# Patient Record
Sex: Female | Born: 2000 | Race: Black or African American | Hispanic: No | Marital: Single | Smoking: Never smoker
Health system: Southern US, Community
[De-identification: ages and names within clinical notes are randomized; demographics above are authoritative.]

---

## 2021-05-24 ENCOUNTER — Emergency Department (HOSPITAL_BASED_OUTPATIENT_CLINIC_OR_DEPARTMENT_OTHER): Payer: BC Managed Care – PPO

## 2021-05-24 ENCOUNTER — Emergency Department (HOSPITAL_BASED_OUTPATIENT_CLINIC_OR_DEPARTMENT_OTHER)
Admission: EM | Admit: 2021-05-24 | Discharge: 2021-05-24 | Disposition: A | Discharge status: Home / Self Care | Payer: BC Managed Care – PPO | Attending: Emergency Medicine | Admitting: Emergency Medicine

## 2021-05-24 ENCOUNTER — Other Ambulatory Visit: Payer: Self-pay

## 2021-05-24 ENCOUNTER — Encounter (HOSPITAL_BASED_OUTPATIENT_CLINIC_OR_DEPARTMENT_OTHER): Payer: Self-pay | Admitting: *Deleted

## 2021-05-24 DIAGNOSIS — M25562 Pain in left knee: Secondary | ICD-10-CM | POA: Diagnosis not present

## 2021-05-24 DIAGNOSIS — R079 Chest pain, unspecified: Secondary | ICD-10-CM | POA: Diagnosis not present

## 2021-05-24 DIAGNOSIS — M542 Cervicalgia: Secondary | ICD-10-CM | POA: Diagnosis not present

## 2021-05-24 DIAGNOSIS — M62830 Muscle spasm of back: Secondary | ICD-10-CM | POA: Insufficient documentation

## 2021-05-24 DIAGNOSIS — M62838 Other muscle spasm: Secondary | ICD-10-CM

## 2021-05-24 DIAGNOSIS — Y92410 Unspecified street and highway as the place of occurrence of the external cause: Secondary | ICD-10-CM | POA: Diagnosis not present

## 2021-05-24 MED ORDER — CYCLOBENZAPRINE HCL 10 MG PO TABS: 10.0000 mg | ORAL_TABLET | Freq: Two times a day (BID) | ORAL | 0 refills | Status: AC | PRN

## 2021-05-24 MED ORDER — LIDOCAINE 5 % EX PTCH: 1.0000 | MEDICATED_PATCH | CUTANEOUS | 0 refills | Status: AC
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
## 2021-11-11 ENCOUNTER — Emergency Department (HOSPITAL_BASED_OUTPATIENT_CLINIC_OR_DEPARTMENT_OTHER)
Admission: EM | Admit: 2021-11-11 | Discharge: 2021-11-11 | Disposition: A | Discharge status: Home / Self Care | Payer: BC Managed Care – PPO | Attending: Emergency Medicine | Admitting: Emergency Medicine

## 2021-11-11 ENCOUNTER — Emergency Department (HOSPITAL_BASED_OUTPATIENT_CLINIC_OR_DEPARTMENT_OTHER): Payer: BC Managed Care – PPO

## 2021-11-11 ENCOUNTER — Encounter (HOSPITAL_BASED_OUTPATIENT_CLINIC_OR_DEPARTMENT_OTHER): Payer: Self-pay | Admitting: Emergency Medicine

## 2021-11-11 ENCOUNTER — Other Ambulatory Visit: Payer: Self-pay

## 2021-11-11 DIAGNOSIS — U071 COVID-19: Secondary | ICD-10-CM | POA: Diagnosis not present

## 2021-11-11 DIAGNOSIS — R059 Cough, unspecified: Secondary | ICD-10-CM | POA: Diagnosis present

## 2021-11-11 LAB — SARS CORONAVIRUS 2 BY RT PCR: SARS Coronavirus 2 by RT PCR: POSITIVE — AB

## 2021-11-11 LAB — GROUP A STREP BY PCR: Group A Strep by PCR: NOT DETECTED

## 2021-11-11 MED ORDER — BENZONATATE 100 MG PO CAPS
200.0000 mg | ORAL_CAPSULE | Freq: Once | ORAL | Status: AC
Start: 2021-11-11 — End: 2021-11-11
  Administered 2021-11-11: 200 mg via ORAL
  Filled 2021-11-11: qty 2

## 2021-11-11 MED ORDER — BENZONATATE 100 MG PO CAPS: 100.0000 mg | ORAL_CAPSULE | Freq: Three times a day (TID) | ORAL | 0 refills | Status: AC

## 2021-11-11 NOTE — ED Provider Notes (Signed)
[facility] EMERGENCY DEPARTMENT Provider Note   CSN: [id number] Arrival date & time: 11/11/21  1932     History  Chief Complaint  Patient presents with   Generalized Body Aches   Cough    Sara Jennings is a 21 y.o. female presenting with chills, body aches, sore throat and cough since Thursday.  No known sick contacts.   Cough Associated symptoms: chills and sore throat   Associated symptoms: no fever        Home Medications Prior to Admission medications   Medication Sig Start Date End Date Taking? Authorizing Provider  cyclobenzaprine (FLEXERIL) 10 MG tablet Take 1 tablet (10 mg total) by mouth 2 (two) times daily as needed for muscle spasms. 05/24/21   Tegeler, Canary Brim, MD  lidocaine (LIDODERM) 5 % Place 1 patch onto the skin daily. Remove & Discard patch within 12 hours or as directed by MD 05/24/21   Tegeler, Canary Brim, MD      Allergies    Patient has no known allergies.    Review of Systems   Review of Systems  Constitutional:  Positive for chills. Negative for fever.  HENT:  Positive for sore throat.   Respiratory:  Positive for cough.   Gastrointestinal:  Negative for diarrhea, nausea and vomiting.  Musculoskeletal:  Positive for neck pain.    Physical Exam Updated Vital Signs BP 122/86 (BP Location: Left Arm)   Pulse (!) 108   Temp 98.2 F (36.8 C) (Oral)   Resp 17   Ht 5\' 11"  (1.803 m)   Wt 65.8 kg   SpO2 98%   BMI 20.22 kg/m  Physical Exam Vitals and nursing note reviewed.  Constitutional:      General: She is not in acute distress.    Appearance: Normal appearance. She is not ill-appearing.  HENT:     Head: Normocephalic and atraumatic.     Right Ear: Tympanic membrane normal.     Left Ear: Tympanic membrane normal.     Mouth/Throat:     Mouth: Mucous membranes are moist.     Pharynx: Oropharynx is clear. No oropharyngeal exudate or posterior oropharyngeal erythema.  Eyes:     General: No scleral icterus.     Conjunctiva/sclera: Conjunctivae normal.  Cardiovascular:     Rate and Rhythm: Normal rate and regular rhythm.  Pulmonary:     Effort: Pulmonary effort is normal. No respiratory distress.     Breath sounds: No wheezing.  Abdominal:     General: Abdomen is flat.  Skin:    Findings: No rash.  Neurological:     Mental Status: She is alert.  Psychiatric:        Mood and Affect: Mood normal.     ED Results / Procedures / Treatments   Labs (all labs ordered are listed, but only abnormal results are displayed) Labs Reviewed  SARS CORONAVIRUS 2 BY RT PCR - Abnormal; Notable for the following components:      Result Value   SARS Coronavirus 2 by RT PCR POSITIVE (*)    All other components within normal limits  GROUP A STREP BY PCR    EKG None  Radiology DG Chest 2 View  Result Date: 11/11/2021 CLINICAL DATA:  Cough, sore throat EXAM: CHEST - 2 VIEW COMPARISON:  05/24/2021 FINDINGS: The heart size and mediastinal contours are within normal limits. Both lungs are clear. The visualized skeletal structures are unremarkable. IMPRESSION: No active cardiopulmonary disease. Electronically Signed   By: 07/24/2021  Rathinasamy M.D.   On: 11/11/2021 20:26    Procedures Procedures   Medications Ordered in ED Medications  benzonatate (TESSALON) capsule 200 mg (has no administration in time range)    ED Course/ Medical Decision Making/ A&P                           Medical Decision Making Amount and/or Complexity of Data Reviewed Radiology: ordered.  Risk Prescription drug management.   21 year old female presenting with URI symptoms.  Work-up positive for COVID.  No health comorbidities, will not do antiviral treatment today.  She is well-appearing and can treat her symptoms with Over-the-counter medications and Tessalon Perles.  She is agreeable to this.  Airway clear, normal vital signs.  Discharged at this time with a work note.  Final Clinical Impression(s) / ED Diagnoses Final  diagnoses:  COVID-19    Rx / DC Orders ED Discharge Orders          Ordered    benzonatate (TESSALON) 100 MG capsule  Every 8 hours        11/11/21 2152           Results and diagnoses were explained to the patient. Return precautions discussed in full. Patient had no additional questions and expressed complete understanding.   This chart was dictated using voice recognition software.  Despite best efforts to proofread,  errors can occur which can change the documentation meaning.     Woodroe Chen 11/11/21 2229    Tegeler, Canary Brim, MD 11/11/21 [phone]

## 2021-11-11 NOTE — Discharge Instructions (Signed)
Your work note is attached.  The medication at your pharmacy is called benzonatate.  Take this as needed for cough and throat irritation.  Return with any symptoms such as severe chest pain, shortness of breath, dizziness or inability to control a fever at home with over-the-counter medications.  It was a pleasure to meet you and I hope that you feel better.

## 2021-11-11 NOTE — ED Triage Notes (Signed)
Generalized body aches, cough, congestion and sore throat since Thursday. Denies n/v/d, abdominal pain.
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
